# Patient Record
Sex: Female | Born: 1990 | Race: Black or African American | Hispanic: No | Marital: Single | Smoking: Never smoker
Health system: Southern US, Community
[De-identification: ages and names within clinical notes are randomized; demographics above are authoritative.]

## PROBLEM LIST (undated history)

## (undated) HISTORY — PX: TONSILLECTOMY: SUR1361

---
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
## 2016-04-06 ENCOUNTER — Encounter: Payer: Self-pay | Admitting: Gastroenterology

## 2016-04-24 ENCOUNTER — Ambulatory Visit: Payer: Self-pay | Admitting: Gastroenterology

## 2016-04-24 ENCOUNTER — Encounter: Payer: Self-pay | Admitting: Gastroenterology

## 2016-04-24 ENCOUNTER — Telehealth: Payer: Self-pay | Admitting: Gastroenterology

## 2016-04-24 NOTE — Telephone Encounter (Signed)
PATIENT WAS A NO SHOW AND LETTER SENT  °

## 2018-02-06 ENCOUNTER — Encounter (HOSPITAL_COMMUNITY): Payer: Self-pay | Admitting: Emergency Medicine

## 2018-02-06 ENCOUNTER — Other Ambulatory Visit: Payer: Self-pay

## 2018-02-06 ENCOUNTER — Emergency Department (HOSPITAL_COMMUNITY): Payer: Medicaid - Out of State

## 2018-02-06 ENCOUNTER — Emergency Department (HOSPITAL_COMMUNITY)
Admission: EM | Admit: 2018-02-06 | Discharge: 2018-02-06 | Disposition: A | Discharge status: Home / Self Care | Payer: Medicaid - Out of State | Attending: Emergency Medicine | Admitting: Emergency Medicine

## 2018-02-06 DIAGNOSIS — O9989 Other specified diseases and conditions complicating pregnancy, childbirth and the puerperium: Secondary | ICD-10-CM | POA: Diagnosis present

## 2018-02-06 DIAGNOSIS — Z3A01 Less than 8 weeks gestation of pregnancy: Secondary | ICD-10-CM | POA: Insufficient documentation

## 2018-02-06 DIAGNOSIS — O210 Mild hyperemesis gravidarum: Secondary | ICD-10-CM | POA: Diagnosis not present

## 2018-02-06 DIAGNOSIS — Z3490 Encounter for supervision of normal pregnancy, unspecified, unspecified trimester: Secondary | ICD-10-CM

## 2018-02-06 DIAGNOSIS — R197 Diarrhea, unspecified: Secondary | ICD-10-CM

## 2018-02-06 DIAGNOSIS — Z79899 Other long term (current) drug therapy: Secondary | ICD-10-CM | POA: Diagnosis not present

## 2018-02-06 DIAGNOSIS — R112 Nausea with vomiting, unspecified: Secondary | ICD-10-CM

## 2018-02-06 DIAGNOSIS — R1030 Lower abdominal pain, unspecified: Secondary | ICD-10-CM | POA: Insufficient documentation

## 2018-02-06 LAB — URINALYSIS, ROUTINE W REFLEX MICROSCOPIC
Bilirubin Urine: NEGATIVE
Glucose, UA: NEGATIVE mg/dL
KETONES UR: NEGATIVE mg/dL
LEUKOCYTES UA: NEGATIVE
Nitrite: NEGATIVE
PROTEIN: NEGATIVE mg/dL
Specific Gravity, Urine: 1.019 (ref 1.005–1.030)
pH: 6 (ref 5.0–8.0)

## 2018-02-06 LAB — I-STAT BETA HCG BLOOD, ED (MC, WL, AP ONLY): HCG, QUANTITATIVE: 75.9 m[IU]/mL — AB (ref <5)

## 2018-02-06 LAB — ABO/RH: ABO/RH(D): A POS

## 2018-02-06 LAB — WET PREP, GENITAL
Clue Cells Wet Prep HPF POC: NONE SEEN
SPERM: NONE SEEN
TRICH WET PREP: NONE SEEN
Yeast Wet Prep HPF POC: NONE SEEN

## 2018-02-06 LAB — CBC
HEMATOCRIT: 39.6 % (ref 36.0–46.0)
HEMOGLOBIN: 11.7 g/dL — AB (ref 12.0–15.0)
MCH: 25 pg — ABNORMAL LOW (ref 26.0–34.0)
MCHC: 29.5 g/dL — AB (ref 30.0–36.0)
MCV: 84.6 fL (ref 78.0–100.0)
Platelets: 348 10*3/uL (ref 150–400)
RBC: 4.68 MIL/uL (ref 3.87–5.11)
RDW: 14.9 % (ref 11.5–15.5)
WBC: 11 10*3/uL — ABNORMAL HIGH (ref 4.0–10.5)

## 2018-02-06 LAB — COMPREHENSIVE METABOLIC PANEL
ALBUMIN: 3 g/dL — AB (ref 3.5–5.0)
ALT: 16 U/L (ref 0–44)
AST: 17 U/L (ref 15–41)
Alkaline Phosphatase: 102 U/L (ref 38–126)
Anion gap: 5 (ref 5–15)
BUN: 6 mg/dL (ref 6–20)
CHLORIDE: 111 mmol/L (ref 98–111)
CO2: 24 mmol/L (ref 22–32)
Calcium: 8.4 mg/dL — ABNORMAL LOW (ref 8.9–10.3)
Creatinine, Ser: 0.91 mg/dL (ref 0.44–1.00)
GFR calc Af Amer: >60 mL/min (ref >60)
GFR calc non Af Amer: >60 mL/min (ref >60)
GLUCOSE: 91 mg/dL (ref 70–99)
POTASSIUM: 4.4 mmol/L (ref 3.5–5.1)
SODIUM: 140 mmol/L (ref 135–145)
Total Bilirubin: 0.5 mg/dL (ref 0.3–1.2)
Total Protein: 7.1 g/dL (ref 6.5–8.1)

## 2018-02-06 LAB — LIPASE, BLOOD: LIPASE: 31 U/L (ref 11–51)

## 2018-02-06 MED ORDER — SODIUM CHLORIDE 0.9 % IV BOLUS
1000.0000 mL | Freq: Once | INTRAVENOUS | Status: AC
  Administered 2018-02-06: 1000 mL via INTRAVENOUS

## 2018-02-06 NOTE — ED Provider Notes (Signed)
[facility] Provider Note   CSN: [id number] Arrival date & time: 02/06/18  1633     History   Chief Complaint Chief Complaint  Patient presents with  . Abdominal Pain    HPI Dwaine DeterKennisha Steffensmeier is a 27 y.o. female who presents today for evaluation of lower abdominal cramping with associated nausea vomiting and diarrhea for 2 days.  She reports that she has had multiple episodes of soft bowel movements, with 3 today.  She denies them being loose, rather that they are soft.  She also reports bilateral lower abdominal cramping with associated pain.  She says that it does not feel as bad as a normal menstrual cycle.  She reports that she took 3 pregnancy tests at home which were positive.  She denies any vaginal spotting or bleeding.  HPI  History reviewed. No pertinent past medical history.  There are no active problems to display for this patient.   Past Surgical History:  Procedure Laterality Date  . TONSILLECTOMY       OB History   None      Home Medications    Prior to Admission medications   Medication Sig Start Date End Date Taking? Authorizing Provider  albuterol (PROVENTIL HFA;VENTOLIN HFA) 108 (90 Base) MCG/ACT inhaler Inhale 2 puffs into the lungs every 4 (four) hours as needed for wheezing or shortness of breath.  01/10/18  Yes [provider]  fluticasone (FLONASE) 50 MCG/ACT nasal spray Place 2 sprays into both nostrils daily as needed (congestion).  12/29/17  Yes [provider]  LORazepam (ATIVAN) 0.5 MG tablet Take 0.5 mg by mouth every 8 (eight) hours as needed for anxiety.  11/24/17  Yes [provider]  phentermine (ADIPEX-P) 37.5 MG tablet Take 37.5 mg by mouth daily. 12/28/17  Yes [provider]  fluconazole (DIFLUCAN) 150 MG tablet Take 150 mg by mouth every 3 (three) days.  12/22/17   [provider]    Family History No family history on file.  Social History Social  History   Tobacco Use  . Smoking status: Never Smoker  . Smokeless tobacco: Never Used  Substance Use Topics  . Alcohol use: Yes  . Drug use: Never     Allergies   Tizanidine and Amoxicillin   Review of Systems Review of Systems  Constitutional: Negative for chills and fever.  HENT: Negative for congestion, ear pain and sore throat.   Eyes: Negative for pain and visual disturbance.  Respiratory: Negative for cough, chest tightness and shortness of breath.   Cardiovascular: Negative for chest pain and palpitations.  Gastrointestinal: Positive for abdominal pain, diarrhea, nausea and vomiting.  Genitourinary: Negative for decreased urine volume, difficulty urinating, dysuria, frequency, hematuria, urgency, vaginal bleeding and vaginal discharge.  Musculoskeletal: Negative for arthralgias and back pain.  Skin: Negative for color change and rash.  Neurological: Negative for seizures, syncope and headaches.  All other systems reviewed and are negative.    Physical Exam Updated Vital Signs BP (!) 111/92   Pulse 84   Temp 98.7 F (37.1 C) (Oral)   Resp 18   Ht 5\' 2"  (1.575 m)   Wt 113.4 kg   LMP 01/10/2018   SpO2 100%   BMI 45.73 kg/m   Physical Exam  Constitutional: She appears well-developed and well-nourished. No distress.  HENT:  Head: Normocephalic and atraumatic.  Eyes: Conjunctivae are normal.  Neck: Neck supple.  Cardiovascular: Normal rate and regular rhythm.  No murmur heard. Pulmonary/Chest: Effort  normal and breath sounds normal. No respiratory distress.  Abdominal: Soft. Normal appearance. Bowel sounds are increased. There is no tenderness.  Genitourinary: Vagina normal. Uterus is not tender. Cervix exhibits no motion tenderness and no discharge. Right adnexum displays no mass, no tenderness and no fullness. Left adnexum displays no mass, no tenderness and no fullness. No erythema or bleeding in the vagina. No foreign body in the vagina.  Genitourinary  Comments: Exam performed with chaperone in room.  Musculoskeletal: She exhibits no edema.  Neurological: She is alert.  Skin: Skin is warm and dry.  Psychiatric: She has a normal mood and affect.  Nursing note and vitals reviewed.     ED Treatments / Results  Labs (all labs ordered are listed, but only abnormal results are displayed) Labs Reviewed  WET PREP, GENITAL - Abnormal; Notable for the following components:      Result Value   WBC, Wet Prep HPF POC MODERATE (*)    All other components within normal limits  COMPREHENSIVE METABOLIC PANEL - Abnormal; Notable for the following components:   Calcium 8.4 (*)    Albumin 3.0 (*)    All other components within normal limits  CBC - Abnormal; Notable for the following components:   WBC 11.0 (*)    Hemoglobin 11.7 (*)    MCH 25.0 (*)    MCHC 29.5 (*)    All other components within normal limits  URINALYSIS, ROUTINE W REFLEX MICROSCOPIC - Abnormal; Notable for the following components:   Hgb urine dipstick SMALL (*)    Bacteria, UA RARE (*)    All other components within normal limits  I-STAT BETA HCG BLOOD, ED ([facility], [facility], [facility] ONLY) - Abnormal; Notable for the following components:   I-stat hCG, quantitative 75.9 (*)    All other components within normal limits  URINE CULTURE  LIPASE, BLOOD  RPR  HIV ANTIBODY (ROUTINE TESTING)  ABO/RH  GC/CHLAMYDIA PROBE AMP ([place]) NOT AT [facility]    EKG None  Radiology US Ob Comp < 14 Wks  Result Date: 02/06/2018 CLINICAL DATA:  Cramping today.  Beta HCG 76. EXAM: OBSTETRIC <14 WK [place] AND TRANSVAGINAL OB US TECHNIQUE: Both transabdominal and transvaginal ultrasound examinations were performed for complete evaluation of the gestation as well as the maternal uterus, adnexal regions, and pelvic cul-de-sac. Transvaginal technique was performed to assess early pregnancy. COMPARISON:  None. FINDINGS: Intrauterine gestational sac: None Yolk sac:  Not Visualized. Embryo:  Not visualized. Maternal  uterus/adnexae: Uterus: 6.4 x 4.1 x 4.1 cm.  Endometrial stripe measures 12 mm. Right ovary: 3.3 x 2.1 x 2.0 cm. Irregular cystic structure on the right ovary measures 1.7 x 0.9 x 1.3 cm. Left ovary: 2.9 x 1.2 x 1.8 cm. No free fluid in the pelvis. IMPRESSION: No intrauterine pregnancy seen. Given patient's last menstrual period, which would place her at 3 weeks 6 days gestation and low quantitative beta HCG, it may be too early to visualize the pregnancy. 1.7 cm irregular cystic structure on the right ovary. This may represent a corpus luteum. However, given that no intrauterine pregnancy is demonstrated, an ectopic pregnancy has not been excluded. Short-term follow-up is recommended. Electronically Signed   By: Ted Mcalpine M.D.   On: 02/06/2018 21:27   US Ob Transvaginal  Result Date: 02/06/2018 CLINICAL DATA:  Cramping today.  Beta HCG 76. EXAM: OBSTETRIC <14 WK [place] AND TRANSVAGINAL OB US TECHNIQUE: Both transabdominal and transvaginal ultrasound examinations were performed for complete evaluation of the gestation as  well as the maternal uterus, adnexal regions, and pelvic cul-de-sac. Transvaginal technique was performed to assess early pregnancy. COMPARISON:  None. FINDINGS: Intrauterine gestational sac: None Yolk sac:  Not Visualized. Embryo:  Not visualized. Maternal uterus/adnexae: Uterus: 6.4 x 4.1 x 4.1 cm.  Endometrial stripe measures 12 mm. Right ovary: 3.3 x 2.1 x 2.0 cm. Irregular cystic structure on the right ovary measures 1.7 x 0.9 x 1.3 cm. Left ovary: 2.9 x 1.2 x 1.8 cm. No free fluid in the pelvis. IMPRESSION: No intrauterine pregnancy seen. Given patient's last menstrual period, which would place her at 3 weeks 6 days gestation and low quantitative beta HCG, it may be too early to visualize the pregnancy. 1.7 cm irregular cystic structure on the right ovary. This may represent a corpus luteum. However, given that no intrauterine pregnancy is demonstrated, an ectopic pregnancy has not  been excluded. Short-term follow-up is recommended. Electronically Signed   By: Ted Mcalpineobrinka  Dimitrova M.D.   On: 02/06/2018 21:27    Procedures Procedures (including critical care time)  Medications Ordered in ED Medications  sodium chloride 0.9 % bolus 1,000 mL (0 mLs Intravenous Stopped 02/06/18 2041)     Initial Impression / Assessment and Plan / ED Course  I have reviewed the triage vital signs and the nursing notes.  Pertinent labs & imaging results that were available during my care of the patient were reviewed by me and considered in my medical decision making (see chart for details).  Clinical Course as of Feb 07 117  Wynelle LinkSun Feb 06, 2018  [id number] Patient just roomed.    [EH]  1907 After walked into room was informed by RN that patient wished to leave.  When discussed this with patient.  She is now willing to stay for fluids, ultrasound, and pelvic exam.   [EH]    Clinical Course User Index [EH] Cristina GongHammond, Elizabeth W, PA-C   Patient presents today for evaluation of diffuse abdominal pain with nausea vomiting diarrhea.  She has a positive pregnancy test, is most likely 1 to [redacted] weeks pregnant.    Pelvic exam was performed without evidence of PID.  White count is mildly elevated, however suspect that this is secondary to most likely viral gastroenteritis.  Ultrasound was performed showing concern for a right sided corpus luteum, however due to the early stages of pregnancy cannot exclude right ovarian ectopic.  Patient was informed of this result.  She is hemodynamically stable while in the emergency room without vaginal spotting or bleeding.  Patient was informed of need for close OB/GYN follow-up.  She was given the information for women's outpatient clinic, along with [facility] if she has any additional concerns or complications.  Suspect that her abdominal pain, nausea vomiting diarrhea secondary to a viral gastroenteritis.  She was rehydrated in the  department.  Discussed with her general instructions for first trimester pregnancy and she stated her understanding.  Return precautions were discussed with patient who states their understanding.  At the time of discharge patient denied any unaddressed complaints or concerns.  Patient is agreeable for discharge home.   Final Clinical Impressions(s) / ED Diagnoses   Final diagnoses:  Pregnancy, unspecified gestational age  Nausea vomiting and diarrhea  Lower abdominal pain    ED Discharge Orders    None       Norman ClayHammond, Elizabeth W, PA-C 02/07/18 0122    Tegeler, Canary Brimhristopher J, MD 02/07/18 1135

## 2018-02-06 NOTE — ED Notes (Signed)
Patient transported to Ultrasound 

## 2018-02-06 NOTE — ED Triage Notes (Addendum)
C/o lower abd cramping, nausea, and diarrhea x 2 days.  2 Positive home pregnancy test.

## 2018-02-06 NOTE — Discharge Instructions (Signed)
For morning sickness and nausea and vomiting in pregnancy you may take Diclegis.  This is an expensive prescription.  The active ingredients are the same as taking unisom or a similar brand (Doxylamine succinate) and vitamin B6 (Pyridoxine hydrochloride).  Please make sure the active ingredient matches the same name as there are many types of unisom and generic medications.  You may take 12.5 of unisom (or generic) every 4-6 hours, if needed you can take up to 25mg .  Please do not take more than 75mg  in 24 hours.   For the vitamin B6 (pyridoxine) you may take 10mg  up to twice a day.   Today your ultrasound showed concern for an ectopic pregnancy versus what is called a corpus luteum cyst which is a natural part of ovulation pregnancy.  If this does represent an ectopic pregnancy it can cause significant complications.  I have given you follow-up with the women's outpatient clinic, please call them Monday morning to schedule a follow-up appointment as soon as possible, and needs to be in the next 2 days.  If you do not wish to go to them you may call other OB/GYN's for an appointment.  Your blood type is a positive.  Please make sure that you are not drinking any alcohol, eating deli meats, or shoveling count letter boxes.  Tylenol is considered safer in pregnancy.  Please do not take any ibuprofen.  These make sure that you are taking a prenatal vitamin.  Please make sure that you are staying well-hydrated.  If you experience any worsening symptoms or additional concerns [facility] for [facility].  You have tests for gonorrhea, chlamydia, HIV, and syphilis all pending.  If positive you will be notified and will need to seek treatment.

## 2018-02-06 NOTE — ED Notes (Signed)
ED Provider at bedside. 

## 2018-02-07 LAB — GC/CHLAMYDIA PROBE AMP (~~LOC~~) NOT AT ARMC
Chlamydia: NEGATIVE
NEISSERIA GONORRHEA: NEGATIVE

## 2018-02-07 LAB — RPR: RPR: NONREACTIVE

## 2018-02-07 LAB — HIV ANTIBODY (ROUTINE TESTING W REFLEX): HIV SCREEN 4TH GENERATION: NONREACTIVE

## 2018-02-08 LAB — URINE CULTURE

## 2019-05-23 IMAGING — US US OB TRANSVAGINAL
1 series · 13 of 28 positions shown · non-contrast
Comparison: None.

CLINICAL DATA: Cramping today.  Beta HCG 76.

EXAM:
OBSTETRIC <14 WK US AND TRANSVAGINAL OB US
TECHNIQUE: Both transabdominal and transvaginal ultrasound examinations were
performed for complete evaluation of the gestation as well as the
maternal uterus, adnexal regions, and pelvic cul-de-sac.
Transvaginal technique was performed to assess early pregnancy.

[Series 1: us ob transvaginal · 0.22mm/px · 13 of 52 slices shown]
[im 2/52]
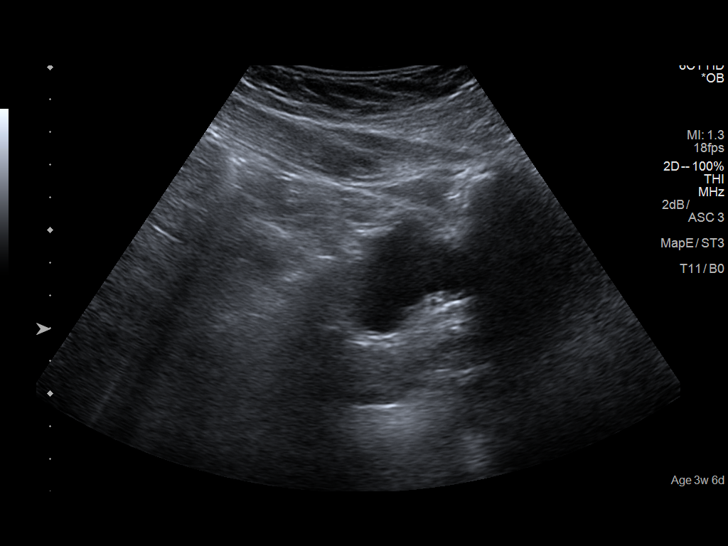
[im 6/52]
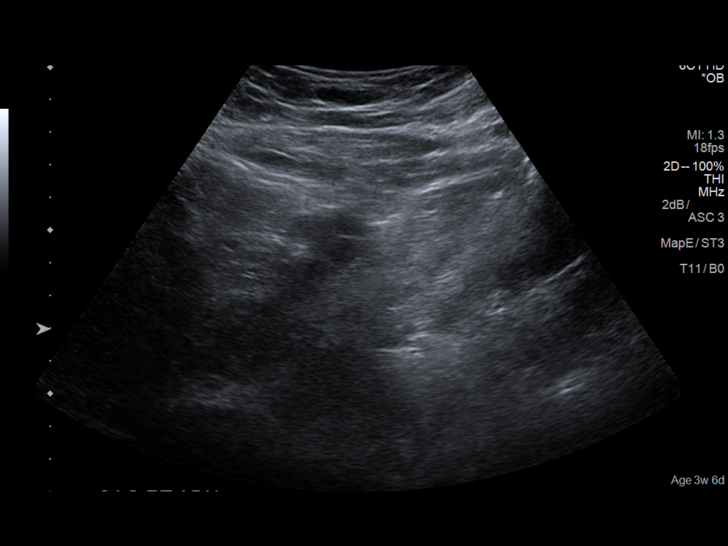
[im 10/52]
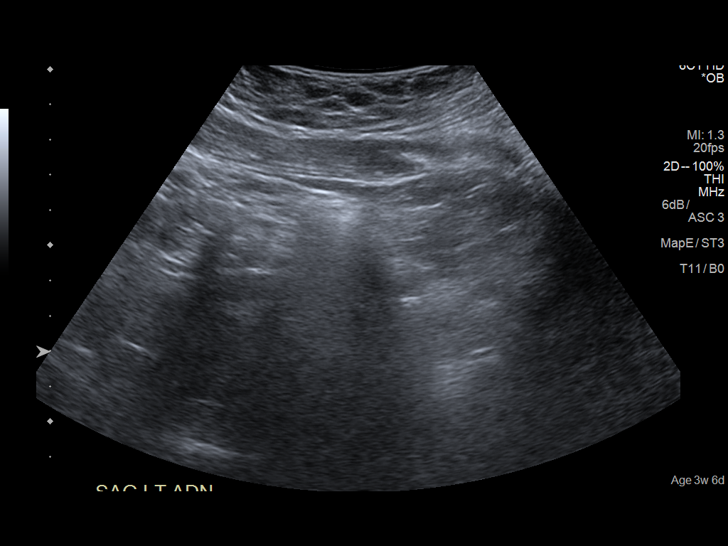
[im 14/52]
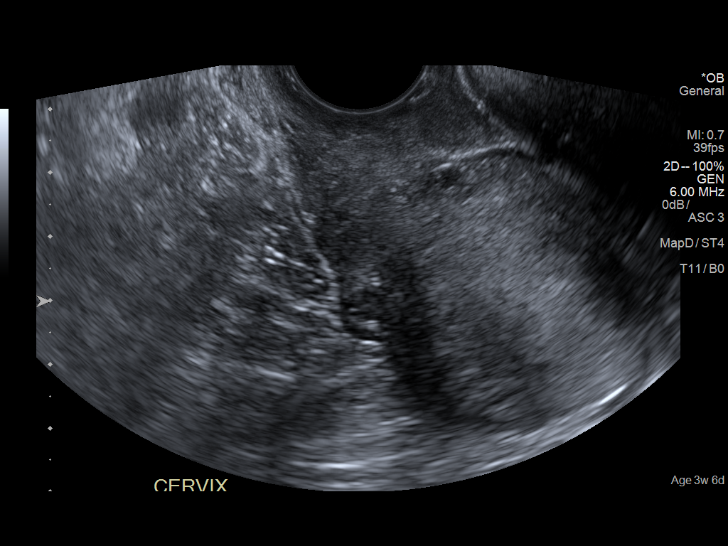
[im 18/52]
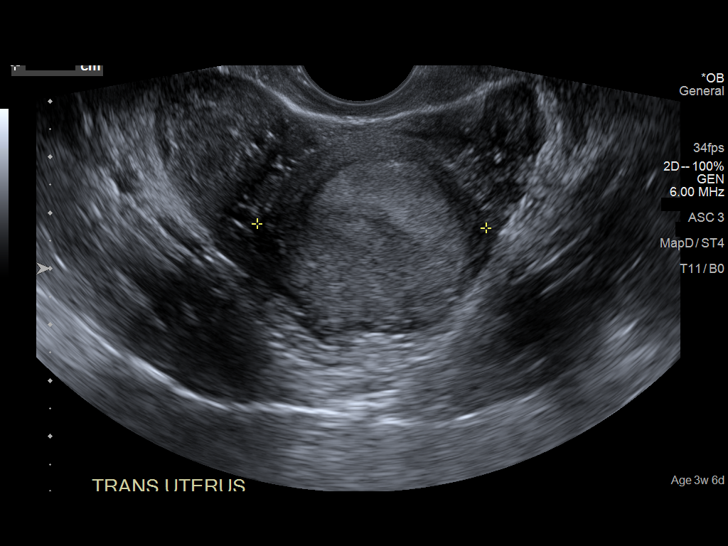
[im 21/52]
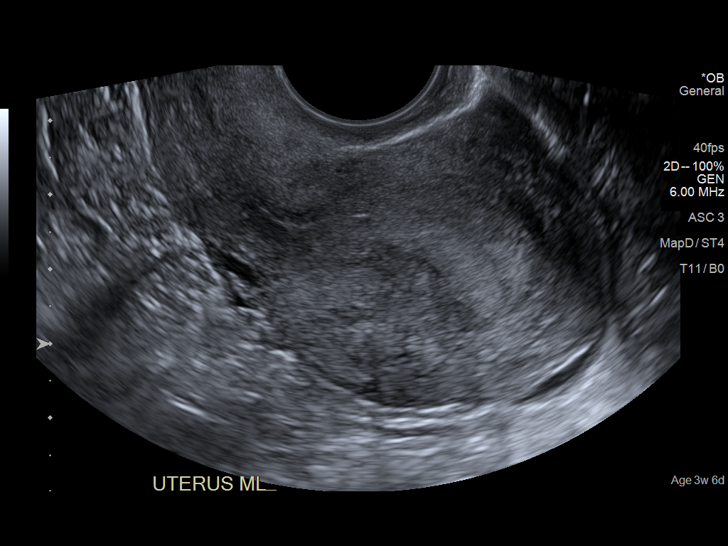
[im 27/52]
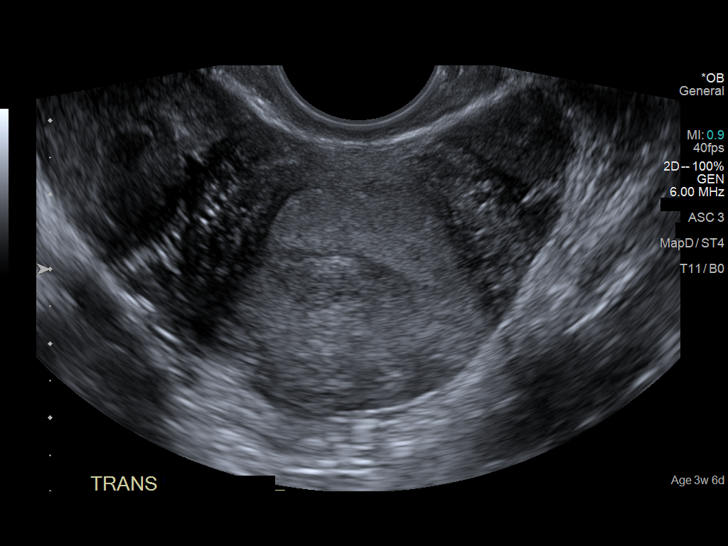
[im 31/52]
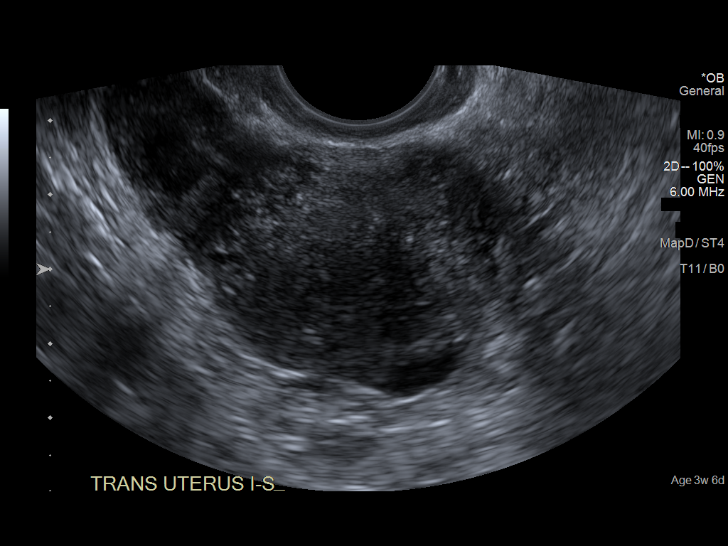
[im 35/52]
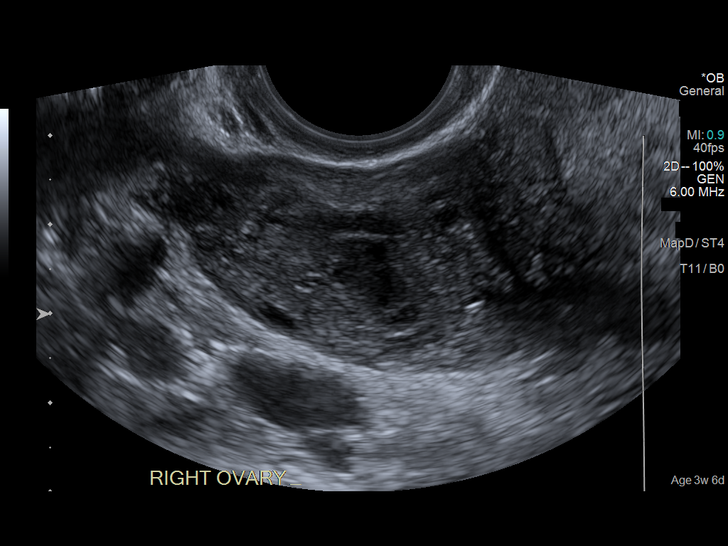
[im 38/52]
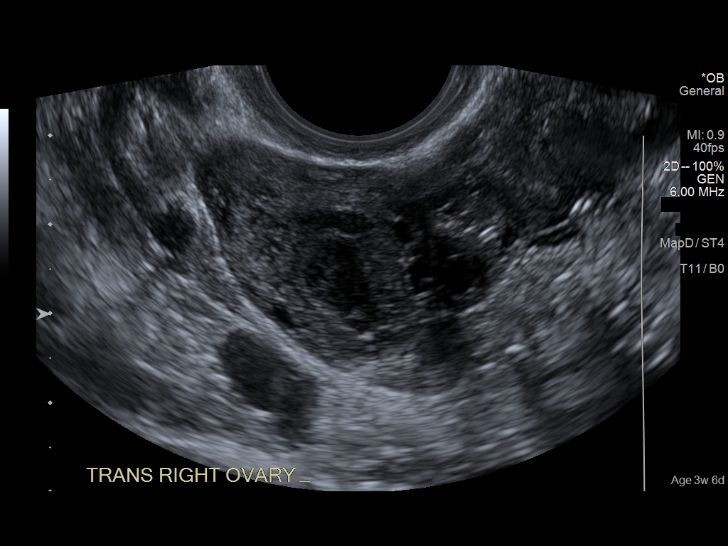
[im 42/52]
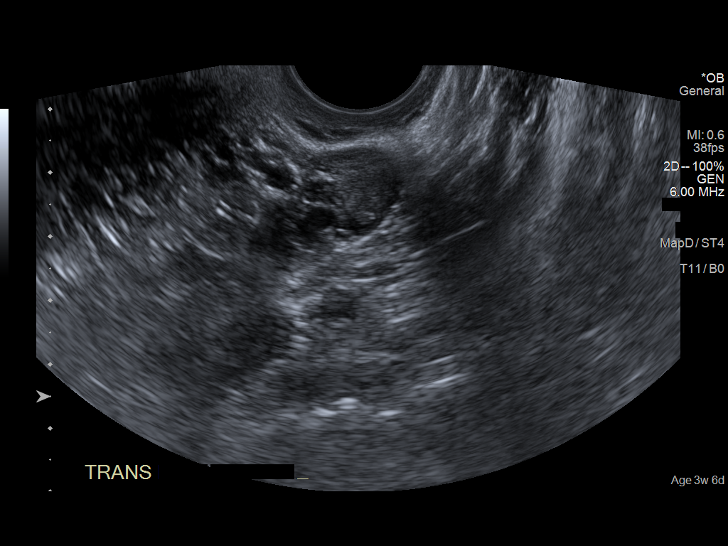
[im 46/52]
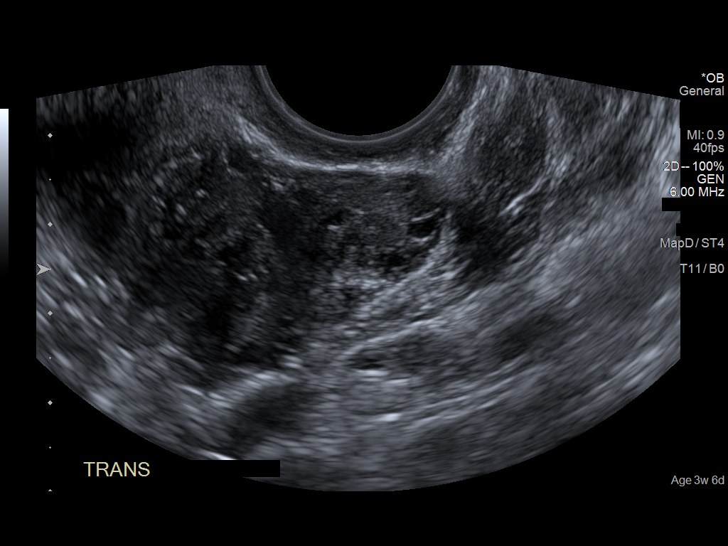
[im 50/52]
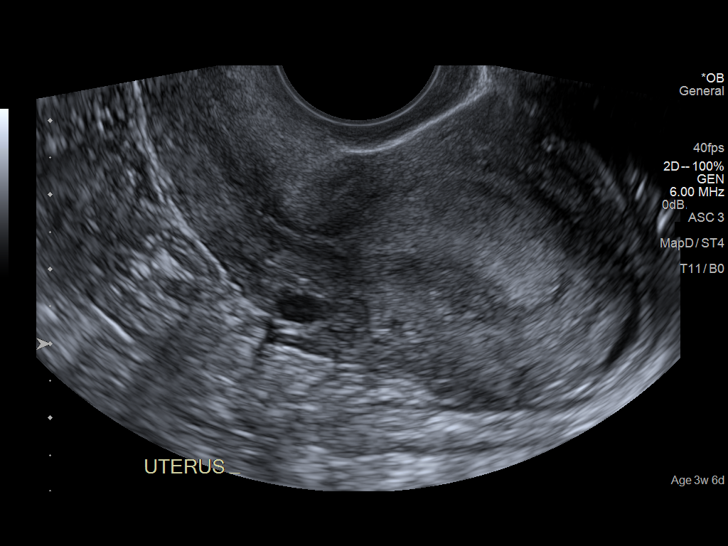

[13 of 28 positions shown; findings below may reference images not displayed]

FINDINGS: Intrauterine gestational sac: None

Yolk sac:  Not Visualized.

Embryo:  Not visualized.

Maternal uterus/adnexae:

Uterus: 6.4 x 4.1 x 4.1 cm.  Endometrial stripe measures 12 mm.

Right ovary: 3.3 x 2.1 x 2.0 cm. Irregular cystic structure on the
right ovary measures 1.7 x 0.9 x 1.3 cm.

Left ovary: 2.9 x 1.2 x 1.8 cm.

No free fluid in the pelvis.
IMPRESSION: No intrauterine pregnancy seen. Given patient's last menstrual
period, which would place her at 3 weeks 6 days gestation and low
quantitative beta HCG, it may be too early to visualize the
pregnancy.

1.7 cm irregular cystic structure on the right ovary. This may
represent a corpus luteum. However, given that no intrauterine
pregnancy is demonstrated, an ectopic pregnancy has not been
excluded. Short-term follow-up is recommended.
